# Patient Record
Sex: Male | Born: 1980
Health system: Southern US, Community
[De-identification: ages and names within clinical notes are randomized; demographics above are authoritative.]

---

## 2001-08-26 ENCOUNTER — Emergency Department (HOSPITAL_COMMUNITY): Admission: EM | Admit: 2001-08-26 | Discharge: 2001-08-26 | Payer: Self-pay | Admitting: Emergency Medicine

## 2001-08-26 ENCOUNTER — Encounter: Payer: Self-pay | Admitting: Emergency Medicine

## 2021-01-06 ENCOUNTER — Emergency Department (HOSPITAL_COMMUNITY)
Admission: EM | Admit: 2021-01-06 | Discharge: 2021-01-07 | Disposition: A | Discharge status: Home / Self Care | Payer: BC Managed Care – PPO | Attending: Emergency Medicine | Admitting: Emergency Medicine

## 2021-01-06 ENCOUNTER — Ambulatory Visit (INDEPENDENT_AMBULATORY_CARE_PROVIDER_SITE_OTHER): Payer: BC Managed Care – PPO

## 2021-01-06 ENCOUNTER — Encounter (HOSPITAL_COMMUNITY): Payer: Self-pay | Admitting: Emergency Medicine

## 2021-01-06 ENCOUNTER — Other Ambulatory Visit: Payer: Self-pay

## 2021-01-06 ENCOUNTER — Encounter: Payer: Self-pay | Admitting: Emergency Medicine

## 2021-01-06 ENCOUNTER — Ambulatory Visit
Admission: EM | Admit: 2021-01-06 | Discharge: 2021-01-06 | Disposition: A | Discharge status: Home / Self Care | Payer: BC Managed Care – PPO | Attending: Internal Medicine | Admitting: Internal Medicine

## 2021-01-06 DIAGNOSIS — R059 Cough, unspecified: Secondary | ICD-10-CM | POA: Diagnosis present

## 2021-01-06 DIAGNOSIS — Z5321 Procedure and treatment not carried out due to patient leaving prior to being seen by health care provider: Secondary | ICD-10-CM | POA: Diagnosis not present

## 2021-01-06 DIAGNOSIS — Z20822 Contact with and (suspected) exposure to covid-19: Secondary | ICD-10-CM | POA: Diagnosis not present

## 2021-01-06 DIAGNOSIS — J1282 Pneumonia due to coronavirus disease 2019: Secondary | ICD-10-CM | POA: Diagnosis not present

## 2021-01-06 DIAGNOSIS — R0602 Shortness of breath: Secondary | ICD-10-CM | POA: Diagnosis not present

## 2021-01-06 DIAGNOSIS — U071 COVID-19: Secondary | ICD-10-CM | POA: Diagnosis not present

## 2021-01-06 LAB — CBC WITH DIFFERENTIAL/PLATELET
Abs Immature Granulocytes: 0.02 10*3/uL (ref 0.00–0.07)
Basophils Absolute: 0 10*3/uL (ref 0.0–0.1)
Basophils Relative: 0 %
Eosinophils Absolute: 0 10*3/uL (ref 0.0–0.5)
Eosinophils Relative: 0 %
HCT: 43.2 % (ref 39.0–52.0)
Hemoglobin: 14.6 g/dL (ref 13.0–17.0)
Immature Granulocytes: 0 %
Lymphocytes Relative: 14 %
Lymphs Abs: 0.8 10*3/uL (ref 0.7–4.0)
MCH: 29 pg (ref 26.0–34.0)
MCHC: 33.8 g/dL (ref 30.0–36.0)
MCV: 85.9 fL (ref 80.0–100.0)
Monocytes Absolute: 0.4 10*3/uL (ref 0.1–1.0)
Monocytes Relative: 7 %
Neutro Abs: 4.3 10*3/uL (ref 1.7–7.7)
Neutrophils Relative %: 79 %
Platelets: 195 10*3/uL (ref 150–400)
RBC: 5.03 MIL/uL (ref 4.22–5.81)
RDW: 12.7 % (ref 11.5–15.5)
WBC: 5.5 10*3/uL (ref 4.0–10.5)
nRBC: 0 % (ref 0.0–0.2)

## 2021-01-06 LAB — COMPREHENSIVE METABOLIC PANEL
ALT: 85 U/L — ABNORMAL HIGH (ref 0–44)
AST: 93 U/L — ABNORMAL HIGH (ref 15–41)
Albumin: 3.6 g/dL (ref 3.5–5.0)
Alkaline Phosphatase: 61 U/L (ref 38–126)
Anion gap: 13 (ref 5–15)
BUN: 12 mg/dL (ref 6–20)
CO2: 23 mmol/L (ref 22–32)
Calcium: 8.5 mg/dL — ABNORMAL LOW (ref 8.9–10.3)
Chloride: 103 mmol/L (ref 98–111)
Creatinine, Ser: 1.37 mg/dL — ABNORMAL HIGH (ref 0.61–1.24)
GFR, Estimated: >60 mL/min (ref >60)
Glucose, Bld: 125 mg/dL — ABNORMAL HIGH (ref 70–99)
Potassium: 3.3 mmol/L — ABNORMAL LOW (ref 3.5–5.1)
Sodium: 139 mmol/L (ref 135–145)
Total Bilirubin: 0.3 mg/dL (ref 0.3–1.2)
Total Protein: 6.9 g/dL (ref 6.5–8.1)

## 2021-01-06 LAB — URINALYSIS, ROUTINE W REFLEX MICROSCOPIC
Glucose, UA: 50 mg/dL — AB
Hgb urine dipstick: NEGATIVE
Ketones, ur: 5 mg/dL — AB
Leukocytes,Ua: NEGATIVE
Nitrite: NEGATIVE
Protein, ur: 100 mg/dL — AB
Specific Gravity, Urine: 1.04 — ABNORMAL HIGH (ref 1.005–1.030)
WBC, UA: >50 WBC/hpf — ABNORMAL HIGH (ref 0–5)
pH: 5 (ref 5.0–8.0)

## 2021-01-06 LAB — RESP PANEL BY RT-PCR (FLU A&B, COVID) ARPGX2
Influenza A by PCR: NEGATIVE
Influenza B by PCR: NEGATIVE
SARS Coronavirus 2 by RT PCR: POSITIVE — AB

## 2021-01-06 LAB — LACTIC ACID, PLASMA: Lactic Acid, Venous: 1.2 mmol/L (ref 0.5–1.9)

## 2021-01-06 MED ORDER — IBUPROFEN 800 MG PO TABS
800.0000 mg | ORAL_TABLET | Freq: Once | ORAL | Status: AC
  Administered 2021-01-06: 800 mg via ORAL

## 2021-01-06 MED ORDER — BENZONATATE 100 MG PO CAPS: 100.0000 mg | ORAL_CAPSULE | Freq: Three times a day (TID) | ORAL | 0 refills | Status: DC

## 2021-01-06 MED ORDER — ALBUTEROL SULFATE HFA 108 (90 BASE) MCG/ACT IN AERS: 1.0000 | INHALATION_SPRAY | Freq: Four times a day (QID) | RESPIRATORY_TRACT | 0 refills | Status: DC | PRN

## 2021-01-06 MED ORDER — ONDANSETRON 4 MG PO TBDP: 4.0000 mg | ORAL_TABLET | Freq: Three times a day (TID) | ORAL | 0 refills | Status: DC | PRN

## 2021-01-06 MED ORDER — IBUPROFEN 600 MG PO TABS: 600.0000 mg | ORAL_TABLET | Freq: Four times a day (QID) | ORAL | 0 refills | Status: DC | PRN

## 2021-01-06 NOTE — Discharge Instructions (Addendum)
Your chest x-ray shows that you have COVID-19 pneumonia Your oxygen level is 92% and your heart rate is about 120.  I will encourage you to go to the emergency department for further management.

## 2021-01-06 NOTE — ED Triage Notes (Signed)
Pt c/o cough onset 1 week associated w/SOB, fever that has subsided.   Denies vomiting, nausea, diarrhea  Taking OTC cold meds w/temp relief.   A&O x4... NAD.Marland Kitchen. ambulatory

## 2021-01-06 NOTE — ED Provider Notes (Signed)
[facility]    CSN: [id number] Arrival date & time: 01/06/21  1346      History   Chief Complaint Chief Complaint  Patient presents with  . URI    HPI Mason Barnett is a 40 y.o. male comes to urgent care with 1 week history of worsening shortness of breath, fever of 103 Fahrenheit, nonproductive cough and chest pressure.  Patient was exposed to COVID-19 virus about a week and a half ago.  He started having symptoms around New Year's Day.  His symptoms were marked by cough, runny nose, sore throat and low-grade fever.  Over the past few days he has had worsening cough, fever and developed significant shortness of breath this morning.  No dizziness, near syncope or syncopal episode.  No chest pain or wheezing.  He has had worsening nausea, vomiting and diarrhea.   HPI  History reviewed. No pertinent past medical history.  There are no problems to display for this patient.   History reviewed. No pertinent surgical history.     Home Medications    Prior to Admission medications   Medication Sig Start Date End Date Taking? Authorizing Provider  albuterol (VENTOLIN HFA) 108 (90 Base) MCG/ACT inhaler Inhale 1-2 puffs into the lungs every 6 (six) hours as needed for wheezing or shortness of breath. 01/06/21  Yes Tylen Leverich, Britta Mccreedy, MD  benzonatate (TESSALON) 100 MG capsule Take 1 capsule (100 mg total) by mouth every 8 (eight) hours. 01/06/21  Yes Retha Bither, Britta Mccreedy, MD  ibuprofen (ADVIL) 600 MG tablet Take 1 tablet (600 mg total) by mouth every 6 (six) hours as needed. 01/06/21  Yes Cray Monnin, Britta Mccreedy, MD  ondansetron (ZOFRAN ODT) 4 MG disintegrating tablet Take 1 tablet (4 mg total) by mouth every 8 (eight) hours as needed for nausea or vomiting. 01/06/21  Yes Nisaiah Bechtol, Britta Mccreedy, MD    Family History History reviewed. No pertinent family history.  Social History Social History   Tobacco Use  . Smoking status: Never Smoker  . Smokeless tobacco: Never Used      Allergies   Patient has no known allergies.   Review of Systems Review of Systems  Constitutional: Positive for chills, fatigue and fever.  HENT: Positive for congestion and sore throat. Negative for rhinorrhea.   Respiratory: Positive for cough, chest tightness and shortness of breath. Negative for wheezing.   Gastrointestinal: Positive for diarrhea, nausea and vomiting. Negative for abdominal pain.  Genitourinary: Negative.   Musculoskeletal: Negative for joint swelling, neck pain and neck stiffness.  Neurological: Positive for dizziness, light-headedness and headaches.  Psychiatric/Behavioral: Negative for confusion.     Physical Exam Triage Vital Signs ED Triage Vitals  Enc Vitals Group     BP 01/06/21 1754 133/85     Pulse Rate 01/06/21 1754 (!) 118     Resp 01/06/21 1754 16     Temp 01/06/21 1754 (!) 103.1 F (39.5 C)     Temp Source 01/06/21 1754 Oral     SpO2 01/06/21 1754 92 %     Weight --      Height --      Head Circumference --      Peak Flow --      Pain Score 01/06/21 1709 0     Pain Loc --      Pain Edu? --      Excl. in GC? --    No data found.  Updated Vital Signs BP 133/85 (BP Location: Left Arm)  Pulse (!) 118   Temp (!) 103.1 F (39.5 C) (Oral)   Resp 16   SpO2 92%   Visual Acuity Right Eye Distance:   Left Eye Distance:   Bilateral Distance:    Right Eye Near:   Left Eye Near:    Bilateral Near:     Physical Exam Vitals and nursing note reviewed.  Constitutional:      General: He is in acute distress.     Appearance: He is ill-appearing and toxic-appearing.  Cardiovascular:     Rate and Rhythm: Tachycardia present.     Pulses: Normal pulses.     Heart sounds: Normal heart sounds.  Pulmonary:     Effort: Pulmonary effort is normal.     Comments: Decreased air entry in the lungs bilaterally.  No expiratory wheezing.  No rhonchi noted. Abdominal:     General: Bowel sounds are normal. There is no distension.      Palpations: Abdomen is soft. There is no mass.     Hernia: No hernia is present.  Musculoskeletal:        General: Normal range of motion.  Skin:    Capillary Refill: Capillary refill takes less than 2 seconds.  Neurological:     Mental Status: He is alert.      UC Treatments / Results  Labs (all labs ordered are listed, but only abnormal results are displayed) Labs Reviewed  COVID-19, FLU A+B NAA    EKG   Radiology No results found.  Procedures Procedures (including critical care time)  Medications Ordered in UC Medications  ibuprofen (ADVIL) tablet 800 mg (800 mg Oral Given 01/06/21 1802)    Initial Impression / Assessment and Plan / UC Course  I have reviewed the triage vital signs and the nursing notes.  Pertinent labs & imaging results that were available during my care of the patient were reviewed by me and considered in my medical decision making (see chart for details).      1.  Pneumonia due to COVID-19 virus: Chest x-ray is consistent with heterogeneous bilateral airspace opacities consistent with COVID-19 pneumonia Patient has a pulse oximetry of 92% and a heart rate of 118. Patient will need further work-up and higher level of care.  Patient is advised to go to the emergency department.  Patient agrees to go to the emergency department Final Clinical Impressions(s) / UC Diagnoses   Final diagnoses:  Pneumonia due to COVID-19 virus     Discharge Instructions     Your chest x-ray shows that you have COVID-19 pneumonia Your oxygen level is 92% and your heart rate is about 120.  I will encourage you to go to the emergency department for further management.   ED Prescriptions    Medication Sig Dispense Auth. Provider   albuterol (VENTOLIN HFA) 108 (90 Base) MCG/ACT inhaler Inhale 1-2 puffs into the lungs every 6 (six) hours as needed for wheezing or shortness of breath. 18 g Nickolai Rinks, Britta Mccreedy, MD   ondansetron (ZOFRAN ODT) 4 MG disintegrating tablet  Take 1 tablet (4 mg total) by mouth every 8 (eight) hours as needed for nausea or vomiting. 20 tablet Ronnisha Felber, Britta Mccreedy, MD   benzonatate (TESSALON) 100 MG capsule Take 1 capsule (100 mg total) by mouth every 8 (eight) hours. 21 capsule Izack Hoogland, Britta Mccreedy, MD   ibuprofen (ADVIL) 600 MG tablet Take 1 tablet (600 mg total) by mouth every 6 (six) hours as needed. 30 tablet Latorya Bautch, Britta Mccreedy, MD  PDMP not reviewed this encounter.   Merrilee Jansky, MD 01/06/21 [phone]

## 2021-01-06 NOTE — ED Triage Notes (Signed)
Pt c/o increased cough/shob, seen at UC, sent here for further eval of xray showing COVID PNA.  Pts wife +, pt has not been vaccinated

## 2021-01-07 NOTE — ED Notes (Signed)
Pt name called 3x for updated vitals, no response  

## 2021-01-08 LAB — COVID-19, FLU A+B NAA
Influenza A, NAA: NOT DETECTED
Influenza B, NAA: NOT DETECTED
SARS-CoV-2, NAA: DETECTED — AB

## 2021-01-11 ENCOUNTER — Inpatient Hospital Stay (HOSPITAL_COMMUNITY)
Admission: EM | Admit: 2021-01-11 | Discharge: 2021-01-14 | DRG: 177 | Disposition: A | Discharge status: Home / Self Care | Payer: BC Managed Care – PPO | Attending: Internal Medicine | Admitting: Internal Medicine

## 2021-01-11 ENCOUNTER — Encounter (HOSPITAL_COMMUNITY): Payer: Self-pay | Admitting: *Deleted

## 2021-01-11 ENCOUNTER — Emergency Department (HOSPITAL_COMMUNITY): Payer: BC Managed Care – PPO

## 2021-01-11 ENCOUNTER — Other Ambulatory Visit: Payer: Self-pay

## 2021-01-11 DIAGNOSIS — R0602 Shortness of breath: Secondary | ICD-10-CM | POA: Diagnosis not present

## 2021-01-11 DIAGNOSIS — U071 COVID-19: Secondary | ICD-10-CM | POA: Diagnosis not present

## 2021-01-11 DIAGNOSIS — Z8249 Family history of ischemic heart disease and other diseases of the circulatory system: Secondary | ICD-10-CM

## 2021-01-11 DIAGNOSIS — J9601 Acute respiratory failure with hypoxia: Secondary | ICD-10-CM | POA: Diagnosis present

## 2021-01-11 DIAGNOSIS — R0902 Hypoxemia: Secondary | ICD-10-CM

## 2021-01-11 DIAGNOSIS — J1282 Pneumonia due to coronavirus disease 2019: Secondary | ICD-10-CM | POA: Diagnosis present

## 2021-01-11 LAB — COMPREHENSIVE METABOLIC PANEL
ALT: 109 U/L — ABNORMAL HIGH (ref 0–44)
AST: 58 U/L — ABNORMAL HIGH (ref 15–41)
Albumin: 2.9 g/dL — ABNORMAL LOW (ref 3.5–5.0)
Alkaline Phosphatase: 105 U/L (ref 38–126)
Anion gap: 14 (ref 5–15)
BUN: 14 mg/dL (ref 6–20)
CO2: 25 mmol/L (ref 22–32)
Calcium: 8.6 mg/dL — ABNORMAL LOW (ref 8.9–10.3)
Chloride: 101 mmol/L (ref 98–111)
Creatinine, Ser: 1.09 mg/dL (ref 0.61–1.24)
GFR, Estimated: >60 mL/min (ref >60)
Glucose, Bld: 127 mg/dL — ABNORMAL HIGH (ref 70–99)
Potassium: 3.5 mmol/L (ref 3.5–5.1)
Sodium: 140 mmol/L (ref 135–145)
Total Bilirubin: 2.4 mg/dL — ABNORMAL HIGH (ref 0.3–1.2)
Total Protein: 6.5 g/dL (ref 6.5–8.1)

## 2021-01-11 LAB — C-REACTIVE PROTEIN: CRP: 5.1 mg/dL — ABNORMAL HIGH (ref <1.0)

## 2021-01-11 LAB — LIPASE, BLOOD: Lipase: 37 U/L (ref 11–51)

## 2021-01-11 LAB — CBC
HCT: 44.1 % (ref 39.0–52.0)
Hemoglobin: 14.2 g/dL (ref 13.0–17.0)
MCH: 28.6 pg (ref 26.0–34.0)
MCHC: 32.2 g/dL (ref 30.0–36.0)
MCV: 88.7 fL (ref 80.0–100.0)
Platelets: 354 10*3/uL (ref 150–400)
RBC: 4.97 MIL/uL (ref 4.22–5.81)
RDW: 12.5 % (ref 11.5–15.5)
WBC: 8.5 10*3/uL (ref 4.0–10.5)
nRBC: 0 % (ref 0.0–0.2)

## 2021-01-11 LAB — LACTATE DEHYDROGENASE: LDH: 414 U/L — ABNORMAL HIGH (ref 98–192)

## 2021-01-11 LAB — FERRITIN: Ferritin: 1703 ng/mL — ABNORMAL HIGH (ref 24–336)

## 2021-01-11 LAB — TRIGLYCERIDES: Triglycerides: 193 mg/dL — ABNORMAL HIGH (ref <150)

## 2021-01-11 LAB — FIBRINOGEN: Fibrinogen: 665 mg/dL — ABNORMAL HIGH (ref 210–475)

## 2021-01-11 LAB — LACTIC ACID, PLASMA
Lactic Acid, Venous: 2.2 mmol/L (ref 0.5–1.9)
Lactic Acid, Venous: 2 mmol/L (ref 0.5–1.9)

## 2021-01-11 LAB — D-DIMER, QUANTITATIVE: D-Dimer, Quant: 1.82 ug/mL-FEU — ABNORMAL HIGH (ref 0.00–0.50)

## 2021-01-11 LAB — PROCALCITONIN: Procalcitonin: 0.1 ng/mL

## 2021-01-11 MED ORDER — DEXAMETHASONE SODIUM PHOSPHATE 10 MG/ML IJ SOLN
6.0000 mg | Freq: Once | INTRAMUSCULAR | Status: AC
  Administered 2021-01-11: 6 mg via INTRAVENOUS
  Filled 2021-01-11: qty 1

## 2021-01-11 MED ORDER — LACTATED RINGERS IV BOLUS
500.0000 mL | Freq: Once | INTRAVENOUS | Status: AC
  Administered 2021-01-11: 500 mL via INTRAVENOUS

## 2021-01-11 MED ORDER — ENOXAPARIN SODIUM 60 MG/0.6ML ~~LOC~~ SOLN
55.0000 mg | SUBCUTANEOUS | Status: DC
  Administered 2021-01-11 – 2021-01-14 (×4): 55 mg via SUBCUTANEOUS
  Filled 2021-01-11: qty 0.6
  Filled 2021-01-11 (×4): qty 0.55

## 2021-01-11 MED ORDER — GUAIFENESIN-DM 100-10 MG/5ML PO SYRP
5.0000 mL | ORAL_SOLUTION | ORAL | Status: DC | PRN
  Administered 2021-01-11: 5 mL via ORAL
  Filled 2021-01-11 (×2): qty 5

## 2021-01-11 MED ORDER — SODIUM CHLORIDE 0.9 % IV SOLN
100.0000 mg | Freq: Every day | INTRAVENOUS | Status: DC
  Filled 2021-01-11: qty 20

## 2021-01-11 MED ORDER — SODIUM CHLORIDE 0.9 % IV SOLN
200.0000 mg | Freq: Once | INTRAVENOUS | Status: AC
  Administered 2021-01-11: 200 mg via INTRAVENOUS
  Filled 2021-01-11: qty 40

## 2021-01-11 MED ORDER — DEXAMETHASONE 6 MG PO TABS
6.0000 mg | ORAL_TABLET | Freq: Every day | ORAL | Status: DC
  Administered 2021-01-12 – 2021-01-14 (×3): 6 mg via ORAL
  Filled 2021-01-11: qty 1
  Filled 2021-01-11 (×2): qty 2

## 2021-01-11 NOTE — ED Notes (Signed)
Pt transported to xray 

## 2021-01-11 NOTE — ED Triage Notes (Signed)
Pt states he has covid pneumonia and is here for shortness of breath. Has had some diarrhea. Weakness. Saturations on room air 90%, placed on 2 liters.

## 2021-01-11 NOTE — ED Provider Notes (Signed)
[facility] EMERGENCY DEPARTMENT Provider Note   CSN: [id number] Arrival date & time: 01/11/21  0454     History Chief Complaint  Patient presents with  . Shortness of Breath    Mason Barnett is a 40 y.o. male who presents to the ED via EMS with complaint of worsening shortness of breath over the past week. Pt was seen at Urgent Care on 1/10 with complaints of nonproductive cough, chest pressure, fever with tmax 103.0, and shortness of breath. He was positive for COVID 19 at that time. Per chart review pt's O2 was around 92% at that time and he was recommended to come to the ED for further evaluation. Pt came to the ED however LWBS. He returns today for worsening symptoms. With EMS pt was found to have an O2 sat of 90% at rest and placed on 3L. Pt denies any additional symptoms including nausea, vomiting, diarrhea, chest pain, coughing up blood, or any other associated symptoms. He is not typically on oxygen.   The history is provided by the patient and medical records.       History reviewed. No pertinent past medical history.  There are no problems to display for this patient.   History reviewed. No pertinent surgical history.     No family history on file.  Social History   Tobacco Use  . Smoking status: Never Smoker  . Smokeless tobacco: Never Used  Substance Use Topics  . Alcohol use: Not Currently  . Drug use: Never    Home Medications Prior to Admission medications   Medication Sig Start Date End Date Taking? Authorizing Provider  albuterol (VENTOLIN HFA) 108 (90 Base) MCG/ACT inhaler Inhale 1-2 puffs into the lungs every 6 (six) hours as needed for wheezing or shortness of breath. 01/06/21   Lamptey, Britta Mccreedy, MD  benzonatate (TESSALON) 100 MG capsule Take 1 capsule (100 mg total) by mouth every 8 (eight) hours. 01/06/21   Merrilee Jansky, MD  ibuprofen (ADVIL) 600 MG tablet Take 1 tablet (600 mg total) by mouth every 6 (six) hours as  needed. 01/06/21   Lamptey, Britta Mccreedy, MD  ondansetron (ZOFRAN ODT) 4 MG disintegrating tablet Take 1 tablet (4 mg total) by mouth every 8 (eight) hours as needed for nausea or vomiting. 01/06/21   Lamptey, Britta Mccreedy, MD    Allergies    Patient has no known allergies.  Review of Systems   Review of Systems  Constitutional: Positive for chills, fatigue and fever.  Respiratory: Positive for cough and shortness of breath.   Cardiovascular: Negative for chest pain.  Gastrointestinal: Negative for abdominal pain, diarrhea, nausea and vomiting.  Musculoskeletal: Positive for myalgias.  All other systems reviewed and are negative.   Physical Exam Updated Vital Signs BP (!) 106/59 (BP Location: Right Arm)   Pulse 86   Temp 98.3 F (36.8 C) (Oral)   Resp 18   SpO2 97%   Physical Exam Vitals and nursing note reviewed.  Constitutional:      Appearance: He is ill-appearing and diaphoretic.  HENT:     Head: Normocephalic and atraumatic.  Eyes:     Conjunctiva/sclera: Conjunctivae normal.  Cardiovascular:     Rate and Rhythm: Normal rate and regular rhythm.     Pulses: Normal pulses.  Pulmonary:     Effort: Tachypnea present.     Breath sounds: Decreased breath sounds present. No wheezing, rhonchi or rales.     Comments: Pt tachypneic with diminished breath sounds throughout  all lung fields. Speaking in short sentences. Satting 90-92% at rest. With ambulation pt only able to walk a very short distance before sitting down. Once seated his O2 sat dropped to 89% and took several minutes to go back up to 90%. Placed on 2L.  Chest:     Chest wall: No tenderness.  Abdominal:     Palpations: Abdomen is soft.     Tenderness: There is no abdominal tenderness. There is no guarding or rebound.  Musculoskeletal:     Cervical back: Neck supple.     Right lower leg: No edema.     Left lower leg: No edema.  Skin:    General: Skin is warm.  Neurological:     Mental Status: He is alert.     ED  Results / Procedures / Treatments   Labs (all labs ordered are listed, but only abnormal results are displayed) Labs Reviewed  COMPREHENSIVE METABOLIC PANEL - Abnormal; Notable for the following components:      Result Value   Glucose, Bld 127 (*)    Calcium 8.6 (*)    Albumin 2.9 (*)    AST 58 (*)    ALT 109 (*)    Total Bilirubin 2.4 (*)    All other components within normal limits  LACTIC ACID, PLASMA - Abnormal; Notable for the following components:   Lactic Acid, Venous 2.2 (*)    All other components within normal limits  D-DIMER, QUANTITATIVE (NOT AT [facility]) - Abnormal; Notable for the following components:   D-Dimer, Quant 1.82 (*)    All other components within normal limits  LACTATE DEHYDROGENASE - Abnormal; Notable for the following components:   LDH 414 (*)    All other components within normal limits  FERRITIN - Abnormal; Notable for the following components:   Ferritin 1,703 (*)    All other components within normal limits  TRIGLYCERIDES - Abnormal; Notable for the following components:   Triglycerides 193 (*)    All other components within normal limits  FIBRINOGEN - Abnormal; Notable for the following components:   Fibrinogen 665 (*)    All other components within normal limits  C-REACTIVE PROTEIN - Abnormal; Notable for the following components:   CRP 5.1 (*)    All other components within normal limits  CULTURE, BLOOD (ROUTINE X 2)  CULTURE, BLOOD (ROUTINE X 2)  LIPASE, BLOOD  CBC  URINALYSIS, ROUTINE W REFLEX MICROSCOPIC  LACTIC ACID, PLASMA  PROCALCITONIN    EKG EKG Interpretation  Date/Time:  Saturday January 11 2021 10:01:37 EST Ventricular Rate:  82 PR Interval:    QRS Duration: 80 QT Interval:  400 QTC Calculation: 468 R Axis:   51 Text Interpretation: Sinus rhythm Normal ECG No old tracing to compare Confirmed by Susy Frizzle [phone]) on 01/11/2021 10:12:59 AM   Radiology DG Chest Portable 1 View  Result Date: 01/11/2021 CLINICAL  DATA:  COVID positive, dyspnea, intermittent cough for 1 week EXAM: PORTABLE CHEST 1 VIEW COMPARISON:  01/06/2021 chest radiograph. FINDINGS: Stable cardiomediastinal silhouette with normal heart size. No pneumothorax. No pleural effusion. Moderate to severe patchy bilateral lung opacities, worsened. IMPRESSION: Worsening moderate to severe patchy bilateral lung opacities compatible with COVID-19 pneumonia. Electronically Signed   By: Delbert Phenix M.D.   On: 01/11/2021 08:52    Procedures Procedures (including critical care time)  Medications Ordered in ED Medications  remdesivir 200 mg in sodium chloride 0.9% 250 mL IVPB (has no administration in time range)    Followed by  remdesivir 100 mg in sodium chloride 0.9 % 100 mL IVPB (has no administration in time range)  dexamethasone (DECADRON) injection 6 mg (6 mg Intravenous Given 01/11/21 1002)    ED Course  I have reviewed the triage vital signs and the nursing notes.  Pertinent labs & imaging results that were available during my care of the patient were reviewed by me and considered in my medical decision making (see chart for details).    MDM Rules/Calculators/A&P                          40 year old male who is unvaccinated presenting to the ED today with EMS for worsening shortness of breath.  Seen at urgent care on 1/10 tested positive for COVID-19.  Does appear that his O2 sats were diminished at that time and 92% and he was advised to come to the ED however left prior to being seen.  With EMS arrival he was found to be at 90% on room air, placed on 3 L and sent here.  Exam patient is tachypneic with diminished breath sounds throughout.  He was taken off of oxygen and his O2 sat was around 90% at rest.  I had patient ambulate in the room he was only able to walk a very short distance before sitting down when his O2 sat decreased to 89% when sitting down it took several minutes to increase.  Given this and her chest x-ray was still show  worsening opacities consistent with COVID-pneumonia I do feel patient will need to be admitted at this time.  We will order preadmission COVID labs.  We do have a positive test in our system and therefore he does not need to be retested.  Lab work was obtained while patient was in the waiting room with a stable creatinine 1.09.  Decadron and remdesivir ordered.   Discussed case with Internal Medicine who will come evaluate patient for admission.   This note was prepared using Dragon voice recognition software and may include unintentional dictation errors due to the inherent limitations of voice recognition software.  Mason Barnett was evaluated in Emergency Department on 01/11/2021 for the symptoms described in the history of present illness. He was evaluated in the context of the global COVID-19 pandemic, which necessitated consideration that the patient might be at risk for infection with the SARS-CoV-2 virus that causes COVID-19. Institutional protocols and algorithms that pertain to the evaluation of patients at risk for COVID-19 are in a state of rapid change based on information released by regulatory bodies including the CDC and federal and state organizations. These policies and algorithms were followed during the patient's care in the ED.  Final Clinical Impression(s) / ED Diagnoses Final diagnoses:  COVID-19  Hypoxia    Rx / DC Orders ED Discharge Orders    None       Tanda Rockers, PA-C 01/11/21 1138    Pollyann Savoy, MD 01/11/21 1229

## 2021-01-11 NOTE — ED Triage Notes (Addendum)
Pt arrives via [facility] from home with c/o generalized weakness. Pt with recent diagnosis covid pna.  EMS arrival to scene, pt saturations 90%, placed on 3 liters [place], HR 71, BP 122/80.

## 2021-01-11 NOTE — H&P (Signed)
Date: 01/11/2021               Patient Name:  Mason Barnett MRN: [id number]  DOB: 10/12/1981 Age / Sex: 40 y.o., male   PCP: Patient, No Pcp Per         Medical Service: Internal Medicine Teaching Service         Attending Physician: Dr. Mikey Bussing    First Contact: Dr. Laural Benes Pager: [phone]  Second Contact: Dr. Cleaster Corin Pager: [phone]       After Hours (After 5p/  First Contact Pager: [phone]  weekends / holidays): Second Contact Pager: [phone]   Chief Complaint: Shortness of breath  History of Present Illness: Mr. Mason Barnett is a 40 year old male with past medical history significant for COVID-19 (tested positive on 01/10) who presented to [facility] on 01/15 for evaluation of worsening shortness of breath.   Patient has not been vaccinated against COVID-19. He reports that on January 1st his wife developed respiratory symptoms. On January 3rd or 4th, his wife tested positive on an at-home antigen test. Shortly thereafter, patient began to develop symptoms of a non-productive cough and sore throat. His symptoms continued to progress, he developed some mild shortness of breath and he had a fever to 103.0 at home so he presented to urgent care on 01/10 for evaluation and was found to have COVID-19 infection. Due to his oxygen saturation being 92% he was advised to come to the ED for evaluation. He presented to the ED, but he left without being seen due to wait times. Since then, he reports that his shortness of breath has been progressively worsening. Yesterday evening, he had difficulty ambulating to the bathroom due to the severity of his symptoms. He denies abdominal pain, nausea, vomiting, lightheadedness, dizziness, night-sweats, or body aches.  ED Course: On arrival to the ED, patient was afebrile, tachycardic (102) and saturating at 91% on room air with improvement to 96% after being placed on 2L oxygen via nasal cannula. EKG normal sinus rhythm. Initial labs revealed  unremarkable CBC; CMP with AST 58, ALT 109, TBili 2.4 and glucose 127; lactic acid 2.2; d-dimer 1.82; fibrinogen 665; triglycerides 193; LDH 414; ferritin 1,703; CRP 5.1; procalcitonin 0.10; blood cultures were collected. CXR revealed worsening moderate to severe patchy bilateral lung opacities compatible with COVID-19 pneumonia. He received 6mg  IV dexamethasone and was admitted to [facility] for further evaluation and management.  Meds:  No current facility-administered medications on file prior to encounter.   Current Outpatient Medications on File Prior to Encounter  Medication Sig Dispense Refill  . albuterol (VENTOLIN HFA) 108 (90 Base) MCG/ACT inhaler Inhale 1-2 puffs into the lungs every 6 (six) hours as needed for wheezing or shortness of breath. 18 g 0  . benzonatate (TESSALON) 100 MG capsule Take 1 capsule (100 mg total) by mouth every 8 (eight) hours. 21 capsule 0  . ibuprofen (ADVIL) 600 MG tablet Take 1 tablet (600 mg total) by mouth every 6 (six) hours as needed. 30 tablet 0  . ondansetron (ZOFRAN ODT) 4 MG disintegrating tablet Take 1 tablet (4 mg total) by mouth every 8 (eight) hours as needed for nausea or vomiting. 20 tablet 0   Allergies: Allergies as of 01/11/2021  . (No Known Allergies)   Past Medical History: Patient denies any prior medical conditions. He was last evaluated by a physician approximately five years ago.  Family History: Mother - Prediabetes Father - Hypertension No family history of lung pathology  Social History: Lives  in [place] with wife, children and parents. He endorses occassional alcohol consumption but denies tobacco use or recreational drug use.  Review of Systems: A complete ROS was negative except as per HPI.   Physical Exam: Blood pressure (!) 135/96, pulse (!) 105, temperature 98.3 F (36.8 C), temperature source Oral, resp. rate 20, SpO2 98 %. Physical Exam Vitals and nursing note reviewed.  Constitutional:      General: He is not  in acute distress. HENT:     Head: Normocephalic and atraumatic.     Mouth/Throat:     Mouth: Mucous membranes are moist.     Pharynx: Oropharynx is clear.  Cardiovascular:     Rate and Rhythm: Normal rate and regular rhythm.  Pulmonary:     Effort: Pulmonary effort is normal. Tachypnea present. No respiratory distress.  Abdominal:     General: Bowel sounds are normal.     Palpations: Abdomen is soft.     Tenderness: There is no abdominal tenderness.  Musculoskeletal:        General: Normal range of motion.     Right lower leg: No edema.     Left lower leg: No edema.  Skin:    General: Skin is warm and dry.     Capillary Refill: Capillary refill takes less than 2 seconds.  Neurological:     General: No focal deficit present.     Mental Status: He is alert and oriented to person, place, and time.  Psychiatric:        Mood and Affect: Mood normal.        Behavior: Behavior normal.    EKG: personally reviewed my interpretation is normal sinus rhythm  CXR: personally reviewed my interpretation is bilateral patchy lung opacities  Assessment & Plan by Problem: Active Problems:   Acute hypoxemic respiratory failure due to COVID-19 Mason Barnett)  Mr. Mason Barnett is a 40 year old male with past medical history significant for COVID-19 (tested positive on 01/10) who presented to [facility] on 01/15 for evaluation of worsening shortness of breath.  #Acute hypoxemic respiratory failure 2/2 COVID-19 pneumonia Unvaccinated patient, currently on day 5 since testing positive for COVID-19 infection, however symptoms had been occurring for several days prior to positive test result. He was saturating ~90% on room air and has responded well to 2L oxygen via nasal cannula. Due to his hypoxia, he would benefit from Barnett admission with administration of dexamethasone and remdesivir. -Continue dexamethasone -Continue remdesivir -CMP tomorrow morning -Continuous pulse oximetry -Nasal cannula to  maintain SpO2 >92% -Airborne/contact precautions -Telemetry -Robitussin Q4H PRN  #Code status: Full code #Diet: Regular #VTE ppx: Lovenox 55mg  Q24H  Dispo: Admit patient to Observation with expected length of stay less than 2 midnights.  Signed: , MD 01/11/2021, 12:23 PM  Pager: [phone] After 5pm on weekdays and 1pm on weekends: On Call pager: [phone]

## 2021-01-12 DIAGNOSIS — U071 COVID-19: Secondary | ICD-10-CM | POA: Diagnosis present

## 2021-01-12 DIAGNOSIS — R0602 Shortness of breath: Secondary | ICD-10-CM | POA: Diagnosis present

## 2021-01-12 DIAGNOSIS — J9601 Acute respiratory failure with hypoxia: Secondary | ICD-10-CM | POA: Diagnosis present

## 2021-01-12 DIAGNOSIS — J1282 Pneumonia due to coronavirus disease 2019: Secondary | ICD-10-CM | POA: Diagnosis present

## 2021-01-12 DIAGNOSIS — Z8249 Family history of ischemic heart disease and other diseases of the circulatory system: Secondary | ICD-10-CM | POA: Diagnosis not present

## 2021-01-12 LAB — CBC WITH DIFFERENTIAL/PLATELET
Abs Immature Granulocytes: 0 10*3/uL (ref 0.00–0.07)
Basophils Absolute: 0 10*3/uL (ref 0.0–0.1)
Basophils Relative: 0 %
Eosinophils Absolute: 0 10*3/uL (ref 0.0–0.5)
Eosinophils Relative: 0 %
HCT: 40.6 % (ref 39.0–52.0)
Hemoglobin: 13 g/dL (ref 13.0–17.0)
Lymphocytes Relative: 8 %
Lymphs Abs: 0.7 10*3/uL (ref 0.7–4.0)
MCH: 28.4 pg (ref 26.0–34.0)
MCHC: 32 g/dL (ref 30.0–36.0)
MCV: 88.8 fL (ref 80.0–100.0)
Monocytes Absolute: 0.3 10*3/uL (ref 0.1–1.0)
Monocytes Relative: 3 %
Neutro Abs: 8.2 10*3/uL — ABNORMAL HIGH (ref 1.7–7.7)
Neutrophils Relative %: 89 %
Platelets: 413 10*3/uL — ABNORMAL HIGH (ref 150–400)
RBC: 4.57 MIL/uL (ref 4.22–5.81)
RDW: 12.6 % (ref 11.5–15.5)
WBC: 9.2 10*3/uL (ref 4.0–10.5)
nRBC: 0 % (ref 0.0–0.2)
nRBC: 0 /100 WBC

## 2021-01-12 LAB — COMPREHENSIVE METABOLIC PANEL
ALT: 129 U/L — ABNORMAL HIGH (ref 0–44)
AST: 81 U/L — ABNORMAL HIGH (ref 15–41)
Albumin: 2.7 g/dL — ABNORMAL LOW (ref 3.5–5.0)
Alkaline Phosphatase: 101 U/L (ref 38–126)
Anion gap: 12 (ref 5–15)
BUN: 18 mg/dL (ref 6–20)
CO2: 24 mmol/L (ref 22–32)
Calcium: 8.3 mg/dL — ABNORMAL LOW (ref 8.9–10.3)
Chloride: 106 mmol/L (ref 98–111)
Creatinine, Ser: 0.99 mg/dL (ref 0.61–1.24)
GFR, Estimated: >60 mL/min (ref >60)
Glucose, Bld: 118 mg/dL — ABNORMAL HIGH (ref 70–99)
Potassium: 3.4 mmol/L — ABNORMAL LOW (ref 3.5–5.1)
Sodium: 142 mmol/L (ref 135–145)
Total Bilirubin: 1.4 mg/dL — ABNORMAL HIGH (ref 0.3–1.2)
Total Protein: 6.1 g/dL — ABNORMAL LOW (ref 6.5–8.1)

## 2021-01-12 LAB — URINALYSIS, ROUTINE W REFLEX MICROSCOPIC
Bacteria, UA: NONE SEEN
Glucose, UA: NEGATIVE mg/dL
Hgb urine dipstick: NEGATIVE
Ketones, ur: 5 mg/dL — AB
Leukocytes,Ua: NEGATIVE
Nitrite: NEGATIVE
Protein, ur: 30 mg/dL — AB
Specific Gravity, Urine: 1.026 (ref 1.005–1.030)
pH: 5 (ref 5.0–8.0)

## 2021-01-12 LAB — HIV ANTIBODY (ROUTINE TESTING W REFLEX): HIV Screen 4th Generation wRfx: NONREACTIVE

## 2021-01-12 LAB — MAGNESIUM: Magnesium: 2.2 mg/dL (ref 1.7–2.4)

## 2021-01-12 LAB — D-DIMER, QUANTITATIVE: D-Dimer, Quant: 1.63 ug/mL-FEU — ABNORMAL HIGH (ref 0.00–0.50)

## 2021-01-12 LAB — FERRITIN: Ferritin: 1267 ng/mL — ABNORMAL HIGH (ref 24–336)

## 2021-01-12 LAB — C-REACTIVE PROTEIN: CRP: 3 mg/dL — ABNORMAL HIGH (ref <1.0)

## 2021-01-12 NOTE — ED Notes (Signed)
Lunch Tray Ordered @ 1037. 

## 2021-01-12 NOTE — Progress Notes (Signed)
Subjective:   Overnight, no acute events.  This morning, patient reports continued cough and shortness of breath with ambulation but not at rest. He denies any new or worsening symptoms. He reports that he has a strong appetite and has been eating and drinking well. He has no further questions or concerns.  Objective:  Vital signs in last 24 hours: Vitals:   01/12/21 0755 01/12/21 0800 01/12/21 0830 01/12/21 0845  BP:  112/75 103/68   Pulse:  97 93 81  Resp:  (!) 30 (!) 27 (!) 22  Temp: 99.1 F (37.3 C)     TempSrc: Oral     SpO2:  (!) 87% (!) 87% 95%  On 4L oxygen via nasal cannula No intake or output data in the 24 hours ending 01/12/21 1003 No weights recorded  Physical Exam Vitals and nursing note reviewed.  Constitutional:      General: He is not in acute distress.    Appearance: He is ill-appearing.  Cardiovascular:     Rate and Rhythm: Normal rate and regular rhythm.  Pulmonary:     Effort: Pulmonary effort is normal.     Comments: Mildly coarse lung sounds throughout lung fields Abdominal:     General: Bowel sounds are normal.     Palpations: Abdomen is soft.     Tenderness: There is no abdominal tenderness.  Skin:    Capillary Refill: Capillary refill takes less than 2 seconds.  Neurological:     General: No focal deficit present.     Mental Status: He is alert and oriented to person, place, and time.  Psychiatric:        Mood and Affect: Mood normal.        Behavior: Behavior normal.     Labs in last 24 hours: CBC Latest Ref Rng & Units 01/12/2021 01/11/2021 01/06/2021  WBC 4.0 - 10.5 K/uL 9.2 8.5 5.5  Hemoglobin 13.0 - 17.0 g/dL 23.5 57.3 22.0  Hematocrit 39.0 - 52.0 % 40.6 44.1 43.2  Platelets 150 - 400 K/uL 413(H) 354 195   CMP Latest Ref Rng & Units 01/12/2021 01/11/2021 01/06/2021  Glucose 70 - 99 mg/dL 254(Y) 706(C) 376(E)  BUN 6 - 20 mg/dL 18 14 12   Creatinine 0.61 - 1.24 mg/dL 8.31 5.17)  Sodium 135 - 145 mmol/L 142 140 139  Potassium  3.5 - 5.1 mmol/L 3.4(L) 3.5 3.3(L)  Chloride 98 - 111 mmol/L 106 101 103  CO2 22 - 32 mmol/L 24 25 23   Calcium 8.9 - 10.3 mg/dL 8.3(L) 8.6(L) 8.5(L)  Total Protein 6.5 - 8.1 g/dL 6.1(L) 6.5 6.9  Total Bilirubin 0.3 - 1.2 mg/dL 6.16(W) 2.4(H) 0.3  Alkaline Phos 38 - 126 U/L 101 105 61  AST 15 - 41 U/L 81(H) 58(H) 93(H)  ALT 0 - 44 U/L 129(H) 109(H) 85(H)   CRP - 3.0, 5.1 Ferritin - 1,267, 1,703 D-dimer - 1.63, 1.82 Mg - 2.2 HIV - nonreactive  Imaging in last 24 hours: No results found.   Assessment/Plan:  Active Problems:   Acute hypoxemic respiratory failure due to COVID-19 [facility])  Mr. Ronnie Doo is a 40 year old male with past medical history significant for COVID-19 (tested positive on 01/10) who presented to [facility] on 01/15 for evaluation of worsening shortness of breath found to have acute hypoxemic respiratory failure 2/2 COVID-19 pneumonia.  #Acute hypoxemic respiratory failure 2/2 COVID-19 pneumonia Unvaccinated patient, currently on day 6 since testing positive for COVID-19 infection. Patient denies new or worsening symptoms at rest  but continues to endorse cough and shortness of breath with ambulation. No fevers. Oxygen requirement escalated from 2L to 4L overnight due to progressive hypoxia. Inflammatory markers trending down. -Continue dexamethasone (day 2/10) -Discontinue remdesivir -CRP, CBC with Diff/Platelet, CMP, D-dimer, Ferritin, Mg daily -Continue 4L oxygen via nasal cannula to maintain SpO2 >92% -Airborne/contact precautions -Telemetry -Robitussin Q4H PRN  #Elevated transaminases, active  AST and ALT trending up in setting of active COVID-19 infection and remdesivir administration. -Continue treatment for COVID-19 as above -Continue monitoring liver function tests on daily labs  #Code status: Full code #Diet: Regular #VTE ppx: Lovenox 55mg  Q24H  , MD 01/12/2021, 10:03 AM Pager: [phone] After 5pm on weekdays and 1pm on  weekends: On Call pager [phone]

## 2021-01-13 LAB — CBC WITH DIFFERENTIAL/PLATELET
Abs Immature Granulocytes: 0.5 10*3/uL — ABNORMAL HIGH (ref 0.00–0.07)
Basophils Absolute: 0 10*3/uL (ref 0.0–0.1)
Basophils Relative: 0 %
Eosinophils Absolute: 0 10*3/uL (ref 0.0–0.5)
Eosinophils Relative: 0 %
HCT: 40.3 % (ref 39.0–52.0)
Hemoglobin: 12.7 g/dL — ABNORMAL LOW (ref 13.0–17.0)
Immature Granulocytes: 5 %
Lymphocytes Relative: 16 %
Lymphs Abs: 1.7 10*3/uL (ref 0.7–4.0)
MCH: 28.2 pg (ref 26.0–34.0)
MCHC: 31.5 g/dL (ref 30.0–36.0)
MCV: 89.4 fL (ref 80.0–100.0)
Monocytes Absolute: 1.3 10*3/uL — ABNORMAL HIGH (ref 0.1–1.0)
Monocytes Relative: 12 %
Neutro Abs: 7.5 10*3/uL (ref 1.7–7.7)
Neutrophils Relative %: 67 %
Platelets: 454 10*3/uL — ABNORMAL HIGH (ref 150–400)
RBC: 4.51 MIL/uL (ref 4.22–5.81)
RDW: 12.7 % (ref 11.5–15.5)
WBC: 11 10*3/uL — ABNORMAL HIGH (ref 4.0–10.5)
nRBC: 0 % (ref 0.0–0.2)

## 2021-01-13 LAB — COMPREHENSIVE METABOLIC PANEL
ALT: 128 U/L — ABNORMAL HIGH (ref 0–44)
AST: 45 U/L — ABNORMAL HIGH (ref 15–41)
Albumin: 2.8 g/dL — ABNORMAL LOW (ref 3.5–5.0)
Alkaline Phosphatase: 90 U/L (ref 38–126)
Anion gap: 11 (ref 5–15)
BUN: 20 mg/dL (ref 6–20)
CO2: 24 mmol/L (ref 22–32)
Calcium: 8.7 mg/dL — ABNORMAL LOW (ref 8.9–10.3)
Chloride: 107 mmol/L (ref 98–111)
Creatinine, Ser: 0.97 mg/dL (ref 0.61–1.24)
GFR, Estimated: >60 mL/min (ref >60)
Glucose, Bld: 120 mg/dL — ABNORMAL HIGH (ref 70–99)
Potassium: 3.6 mmol/L (ref 3.5–5.1)
Sodium: 142 mmol/L (ref 135–145)
Total Bilirubin: 1.1 mg/dL (ref 0.3–1.2)
Total Protein: 6.1 g/dL — ABNORMAL LOW (ref 6.5–8.1)

## 2021-01-13 LAB — C-REACTIVE PROTEIN: CRP: 1.1 mg/dL — ABNORMAL HIGH (ref <1.0)

## 2021-01-13 LAB — FERRITIN: Ferritin: 859 ng/mL — ABNORMAL HIGH (ref 24–336)

## 2021-01-13 LAB — D-DIMER, QUANTITATIVE: D-Dimer, Quant: 1.08 ug/mL-FEU — ABNORMAL HIGH (ref 0.00–0.50)

## 2021-01-13 LAB — MAGNESIUM: Magnesium: 2.2 mg/dL (ref 1.7–2.4)

## 2021-01-13 NOTE — Progress Notes (Signed)
Subjective:   Overnight, no acute events.  This morning, patient reports that he feels well at rest. He endorses shortness of breath and nonproductive cough with ambulation around his room. He denies any new or worsening symptoms. He looks forward to breakfast and moving from the ED to the hospital floor.  Objective:  Vital signs in last 24 hours: Vitals:   01/13/21 0200 01/13/21 0400 01/13/21 0530 01/13/21 0630  BP: 111/66 109/67 (!) 110/59 122/66  Pulse: (!) 52 72 (!) 47 (!) 51  Resp: 17 18 17 18   Temp:  98.7 F (37.1 C)    TempSrc:  Oral    SpO2: 95% 95% 97% 94%  SpO2: 94 % O2 Flow Rate (L/min): 3 L/min  Intake/Output Summary (Last 24 hours) at 01/13/2021 0806 Last data filed at 01/12/2021 1810 Gross per 24 hour  Intake --  Output 475 ml  Net -475 ml   Physical Exam Vitals and nursing note reviewed.  Constitutional:      General: He is not in acute distress.    Appearance: He is obese.  Cardiovascular:     Rate and Rhythm: Normal rate and regular rhythm.  Pulmonary:     Effort: Pulmonary effort is normal. No respiratory distress.     Breath sounds: Normal breath sounds.  Neurological:     Mental Status: He is alert.  Psychiatric:        Mood and Affect: Mood normal.        Behavior: Behavior normal.     Labs in last 24 hours: CBC Latest Ref Rng & Units 01/13/2021 01/12/2021 01/11/2021  WBC 4.0 - 10.5 K/uL 11.0(H) 9.2 8.5  Hemoglobin 13.0 - 17.0 g/dL 12.7(L) 13.0 14.2  Hematocrit 39.0 - 52.0 % 40.3 40.6 44.1  Platelets 150 - 400 K/uL 454(H) 413(H) 354   CMP Latest Ref Rng & Units 01/13/2021 01/12/2021 01/11/2021  Glucose 70 - 99 mg/dL 01/13/2021) 630(Z) 601(U)  BUN 6 - 20 mg/dL 20 18 14   Creatinine 0.61 - 1.24 mg/dL 932(T 5.57  Sodium 135 - 145 mmol/L 142 142 140  Potassium 3.5 - 5.1 mmol/L 3.6 3.4(L) 3.5  Chloride 98 - 111 mmol/L 107 106 101  CO2 22 - 32 mmol/L 24 24 25   Calcium 8.9 - 10.3 mg/dL 3.22) 8.3(L) 8.6(L)  Total Protein 6.5 - 8.1 g/dL 6.1(L)  6.1(L) 6.5  Total Bilirubin 0.3 - 1.2 mg/dL 1.1 0.25) 2.4(H)  Alkaline Phos 38 - 126 U/L 90 101 105  AST 15 - 41 U/L 45(H) 81(H) 58(H)  ALT 0 - 44 U/L 128(H) 129(H) 109(H)   CRP - 1.1 down from 3.0 and 5.1 Ferritin - 859 down from 1,267 and 1,703 D-dimer - 1.08 down from 1.63 and 1.82 Mg - 2.2  Imaging in last 24 hours: No results found.   Assessment/Plan:  Active Problems:   Acute hypoxemic respiratory failure due to COVID-19 [facility])  Mr. Quantavius Humm is a 40 year old male with past medical history significant for COVID-19 (tested positive on 01/10) and obesity who presented to [facility] on 01/15 for evaluation of worsening shortness of breath found to have acute hypoxemic respiratory failure 2/2 COVID-19 pneumonia.  #Acute hypoxemic respiratory failure 2/2 COVID-19 pneumonia, active Unvaccinated patient, currently on day 7 since testing positive for COVID-19 infection. Patient denies new or worsening symptoms at rest but continues to endorse cough and shortness of breath with ambulation. No fevers. Oxygen requirement de-escalated from 4L yesterday to 3L today. Inflammatory markers continue trending down. -Continue dexamethasone (  day 3/10) -CRP, CBC with Diff/Platelet, CMP, D-dimer, Ferritin daily -Continue 3L oxygen via nasal cannula to maintain SpO2 >92%, wean as tolerated -Airborne/contact precautions -Telemetry -Robitussin Q4H PRN -Self proning, incentive spirometry, flutter valve  #Elevated transaminases, active  AST and ALT elevated in the setting of active COVID-19 infection with slight worsening following adminstration of remdesivir on admission. Remdesivir discontinued on 1/16. -Continue treatment for COVID-19 as above -Continue monitoring liver function tests on daily labs  #Code status: Full code #Diet: Regular #VTE ppx: Lovenox 55mg  Q24H  , MD 01/13/2021, 8:06 AM Pager: [phone] After 5pm on weekdays and 1pm on weekends: On Call pager  [phone]

## 2021-01-13 NOTE — ED Notes (Signed)
Pt requesting to use the bedside commode for a bowel movement. Upon standing and sitting on the commode, pt started to have a panic attack stating "Last time I tried to sit up on the toilet I passed out when I tried having a bowel movement and I am just worried this is going to happen again." Pt is requesting to be placed back in bed. Pt placed back in bed with no complications. Pt's O2 saturation dropped to 83%. Titrated O2 to 3l [place]. Pt states he feels better back in bed. Call bell within reach. Bed in low position.

## 2021-01-13 NOTE — ED Notes (Signed)
Dinner tray provided to patient

## 2021-01-13 NOTE — ED Notes (Signed)
Breakfast Ordered 

## 2021-01-13 NOTE — ED Notes (Signed)
Lunch Tray Ordered @ 1109. °

## 2021-01-14 ENCOUNTER — Other Ambulatory Visit (HOSPITAL_COMMUNITY): Payer: Self-pay | Admitting: Student

## 2021-01-14 LAB — COMPREHENSIVE METABOLIC PANEL
ALT: 145 U/L — ABNORMAL HIGH (ref 0–44)
AST: 64 U/L — ABNORMAL HIGH (ref 15–41)
Albumin: 2.8 g/dL — ABNORMAL LOW (ref 3.5–5.0)
Alkaline Phosphatase: 80 U/L (ref 38–126)
Anion gap: 9 (ref 5–15)
BUN: 18 mg/dL (ref 6–20)
CO2: 24 mmol/L (ref 22–32)
Calcium: 8.3 mg/dL — ABNORMAL LOW (ref 8.9–10.3)
Chloride: 109 mmol/L (ref 98–111)
Creatinine, Ser: 0.97 mg/dL (ref 0.61–1.24)
GFR, Estimated: >60 mL/min (ref >60)
Glucose, Bld: 133 mg/dL — ABNORMAL HIGH (ref 70–99)
Potassium: 3.6 mmol/L (ref 3.5–5.1)
Sodium: 142 mmol/L (ref 135–145)
Total Bilirubin: 1 mg/dL (ref 0.3–1.2)
Total Protein: 6.1 g/dL — ABNORMAL LOW (ref 6.5–8.1)

## 2021-01-14 LAB — CBC WITH DIFFERENTIAL/PLATELET
Abs Immature Granulocytes: 0.47 10*3/uL — ABNORMAL HIGH (ref 0.00–0.07)
Basophils Absolute: 0 10*3/uL (ref 0.0–0.1)
Basophils Relative: 0 %
Eosinophils Absolute: 0 10*3/uL (ref 0.0–0.5)
Eosinophils Relative: 0 %
HCT: 36.9 % — ABNORMAL LOW (ref 39.0–52.0)
Hemoglobin: 12.5 g/dL — ABNORMAL LOW (ref 13.0–17.0)
Immature Granulocytes: 4 %
Lymphocytes Relative: 14 %
Lymphs Abs: 1.7 10*3/uL (ref 0.7–4.0)
MCH: 29.7 pg (ref 26.0–34.0)
MCHC: 33.9 g/dL (ref 30.0–36.0)
MCV: 87.6 fL (ref 80.0–100.0)
Monocytes Absolute: 1.4 10*3/uL — ABNORMAL HIGH (ref 0.1–1.0)
Monocytes Relative: 11 %
Neutro Abs: 8.4 10*3/uL — ABNORMAL HIGH (ref 1.7–7.7)
Neutrophils Relative %: 71 %
Platelets: 481 10*3/uL — ABNORMAL HIGH (ref 150–400)
RBC: 4.21 MIL/uL — ABNORMAL LOW (ref 4.22–5.81)
RDW: 12.6 % (ref 11.5–15.5)
WBC: 11.9 10*3/uL — ABNORMAL HIGH (ref 4.0–10.5)
nRBC: 0 % (ref 0.0–0.2)

## 2021-01-14 LAB — FERRITIN: Ferritin: 686 ng/mL — ABNORMAL HIGH (ref 24–336)

## 2021-01-14 LAB — C-REACTIVE PROTEIN: CRP: 0.7 mg/dL (ref <1.0)

## 2021-01-14 LAB — D-DIMER, QUANTITATIVE: D-Dimer, Quant: 0.79 ug/mL-FEU — ABNORMAL HIGH (ref 0.00–0.50)

## 2021-01-14 MED ORDER — GUAIFENESIN-DM 100-10 MG/5ML PO SYRP: 5.0000 mL | ORAL_SOLUTION | ORAL | 0 refills | Status: AC | PRN

## 2021-01-14 MED ORDER — DEXAMETHASONE 6 MG PO TABS: 6.0000 mg | ORAL_TABLET | Freq: Every day | ORAL | 0 refills | Status: DC

## 2021-01-14 MED FILL — ROBAFEN DM CGH-CHEST CONG S: 10-100 | 118 days supply | Qty: 118 | Fill #0

## 2021-01-14 MED FILL — DEXAMETHASONE 6 MG TABLET: 6 | 6 days supply | Qty: 6 | Fill #0

## 2021-01-14 NOTE — Discharge Summary (Signed)
Name: Mason Barnett MRN: [id number] DOB: [birth year] 40 y.o. PCP: Patient, No Pcp Per  Date of Admission: 01/11/2021  4:54 AM Date of Discharge: 01/14/2021 Attending Physician: Mason Aschoff, MD  Discharge Diagnosis: 1. Active Problems:   Acute hypoxemic respiratory failure due to COVID-19 [facility])  Discharge Medications: Allergies as of 01/14/2021   No Known Allergies     Medication List    STOP taking these medications   albuterol 108 (90 Base) MCG/ACT inhaler Commonly known as: VENTOLIN HFA   benzonatate 100 MG capsule Commonly known as: TESSALON   ibuprofen 600 MG tablet Commonly known as: ADVIL   ondansetron 4 MG disintegrating tablet Commonly known as: Zofran ODT     TAKE these medications   dexamethasone 6 MG tablet Commonly known as: DECADRON Take 1 tablet (6 mg total) by mouth daily. Start taking on: January 15, 2021   guaiFENesin-dextromethorphan 100-10 MG/5ML syrup Commonly known as: ROBITUSSIN DM Take 5 mLs by mouth every 4 (four) hours as needed for cough.      Disposition and follow-up:   Mr.Mason Barnett was discharged from [facility] in Stable condition.  At the hospital follow up visit please address:  1.  COVID-19 pneumonia: Patient tested positive for COVID-19 on 01/10 and was hospitalized for acute hypoxic respiratory failure requiring up to 4L oxygen via nasal cannula. He was titrated down to room air and discharged with six additional tablets of dexamethasone to take at home. Please assess patient's symptoms (cough, shortness of breath), oxygen status, and consider obtaining CBC and CMP to confirm resolution of steroid-induced leukocytosis and transaminitis secondary to COVID-19 infection.  2.  Labs / imaging needed at time of follow-up: CBC, CMP  3.  Pending labs/ test needing follow-up: None  Follow-up Appointments:  Follow-up Information    [facility]. Schedule an appointment as soon  as possible for a visit in 1 week(s).   Contact information: [address] [place] [address] [phone]              Hospital Course by problem list: 1. Acute hypoxic respiratory failure 2/2 COVID pneumonia: Patient presented to [facility] on 01/15 following a positive COVID test at urgent care on 01/10 found to have hypoxia to 89% with ambulation which improved on 2L oxygen via nasal cannula. Patient found to have elevated inflammatory markers and pneumonia on CXR. He was started on dexamethasone and remdesivir with escalation of his oxygen requirement to 4L nasal cannula. His remdesivir was discontinued due to bradycardia and mildly elevated transaminases, however he was continued on dexamethasone. His oxygen requirement improved to the point that he was able to maintain SpO2 >92% with ambulation on room air. He was discharged home with plan to complete six additional tablets of dexamethasone and follow-up with [facility].  Discharge Vitals:   BP 122/77 (BP Location: Left Arm)   Pulse (!) 57   Temp 98 F (36.7 C) (Oral)   Resp 20   SpO2 99%   Pertinent Labs, Studies, and Procedures:  CBC Latest Ref Rng & Units 01/14/2021 01/13/2021 01/12/2021  WBC 4.0 - 10.5 K/uL 11.9(H) 11.0(H) 9.2  Hemoglobin 13.0 - 17.0 g/dL 12.5(L) 12.7(L) 13.0  Hematocrit 39.0 - 52.0 % 36.9(L) 40.3 40.6  Platelets 150 - 400 K/uL 481(H) 454(H) 413(H)   CMP Latest Ref Rng & Units 01/14/2021 01/13/2021 01/12/2021  Glucose 70 - 99 mg/dL 956(O) 130(Q) 657(Q)  BUN 6 - 20 mg/dL 18 20 18   Creatinine  0.61 - 1.24 mg/dL 3.01 6.01 0.93  Sodium 135 - 145 mmol/L 142 142 142  Potassium 3.5 - 5.1 mmol/L 3.6 3.6 3.4(L)  Chloride 98 - 111 mmol/L 109 107 106  CO2 22 - 32 mmol/L 24 24 24   Calcium 8.9 - 10.3 mg/dL 8.3(L) 8.7(L) 8.3(L)  Total Protein 6.5 - 8.1 g/dL 6.1(L) 6.1(L) 6.1(L)  Total Bilirubin 0.3 - 1.2 mg/dL 1.0 1.1 )  Alkaline Phos 38 - 126 U/L 80 90 101  AST 15 - 41 U/L 64(H) 45(H) 81(H)  ALT 0 - 44 U/L  145(H) 128(H) 129(H)  DG Chest 2 View  Result Date: 01/06/2021 CLINICAL DATA:  Cough and shortness of breath.  COVID exposure. EXAM: CHEST - 2 VIEW COMPARISON:  None. FINDINGS: Patchy heterogeneous bilateral airspace opacities in a mid-lower lung zone predominant distribution. Heart is normal in size. Normal mediastinal contours. No pleural fluid or pneumothorax. No evidence of pneumomediastinum. No acute osseous abnormalities are seen. IMPRESSION: Patchy heterogeneous bilateral airspace opacities in a mid-lower lung zone predominant distribution, consistent with COVID-19 pneumonia. Parenchymal involvement is moderate by radiograph. Electronically Signed   By: 03/06/2021 M.D.   On: 01/06/2021 18:56   DG Chest Portable 1 View  Result Date: 01/11/2021 CLINICAL DATA:  COVID positive, dyspnea, intermittent cough for 1 week EXAM: PORTABLE CHEST 1 VIEW COMPARISON:  01/06/2021 chest radiograph. FINDINGS: Stable cardiomediastinal silhouette with normal heart size. No pneumothorax. No pleural effusion. Moderate to severe patchy bilateral lung opacities, worsened. IMPRESSION: Worsening moderate to severe patchy bilateral lung opacities compatible with COVID-19 pneumonia. Electronically Signed   By: 03/06/2021 M.D.   On: 01/11/2021 08:52   Discharge Instructions: Discharge Instructions    Call MD for:  difficulty breathing, headache or visual disturbances   Complete by: As directed    Call MD for:  extreme fatigue   Complete by: As directed    Call MD for:  hives   Complete by: As directed    Call MD for:  persistant dizziness or light-headedness   Complete by: As directed    Call MD for:  persistant nausea and vomiting   Complete by: As directed    Call MD for:  redness, tenderness, or signs of infection (pain, swelling, redness, odor or green/yellow discharge around incision site)   Complete by: As directed    Call MD for:  severe uncontrolled pain   Complete by: As directed    Call MD for:   temperature >100.4   Complete by: As directed    Diet - low sodium heart healthy   Complete by: As directed    Increase activity slowly   Complete by: As directed      Signed: 01/13/2021, MD 01/14/2021, 11:46 AM   Pager: [phone]

## 2021-01-14 NOTE — Progress Notes (Signed)
DISCHARGE NOTE HOME Mason Barnett to be discharged Home per MD order. Discussed prescriptions and follow up appointments with the patient. Prescriptions given to patient; medication list explained in detail. Patient verbalized understanding.  Skin clean, dry and intact without evidence of skin break down, no evidence of skin tears noted. IV catheter discontinued intact. Site without signs and symptoms of complications. Dressing and pressure applied. Pt denies pain at the site currently. No complaints noted.  Patient free of lines, drains, and wounds.   An After Visit Summary (AVS) was printed and given to the patient. Patient escorted via wheelchair, and discharged home via private auto.  Lorine Bears, RN

## 2021-01-14 NOTE — Discharge Instructions (Signed)
Mason Barnett,  It was a pleasure meeting you during your recent hospitalization. We are glad that you are doing well. Following discharge, you will need to remain isolated for 20 days following your positive COVID-19 test on 01/06/21. You can break isolation on January 30th. We would like for you to complete your 10 day course of dexamethasone. You have received four doses of this medication in the hospital. You will receive six more pills which you should take daily starting tomorrow. We would like for you to follow-up with our clinic, [facility], for hospital follow-up.  Sincerely, Jasmine December, MD

## 2021-01-14 NOTE — Progress Notes (Signed)
Patient arrived to unit 5 m bed 7 from emergency department .Oriented patient to nursing unit ,call bell ,phone,and personal items within reach.No acute distress noted patient time .Will continue to monitor.

## 2021-01-14 NOTE — Progress Notes (Signed)
Subjective:   Overnight, patient transferred out of ED to hospital floor.  This morning, patient reports that he feels well. He states that his shortness of breath and cough only occur with ambulation and have improved significantly. He states that he has been able to ambulate around his room and to the bathroom without complication. He denies having an established primary care physician and would like to see our clinic for hospital follow-up. He does plan to move soon though, so he may not be able to follow with our clinic for the long-term. He understands and agrees with the need to remain isolated for 21 days since his positive test and complete his 10-day course of dexamethasone.  Objective:  Vital signs in last 24 hours: Vitals:   01/13/21 2300 01/13/21 2315 01/14/21 0047 01/14/21 0537  BP: 126/68 131/72 (!) 141/81 122/77  Pulse: 61 (!) 57 (!) 52 (!) 57  Resp: 17 20 18 20   Temp:  98.6 F (37 C) 98.7 F (37.1 C) 98 F (36.7 C)  TempSrc:  Oral Oral Oral  SpO2: 96% 97% 99% 99%  On room air  Intake/Output Summary (Last 24 hours) at 01/14/2021 1137 Last data filed at 01/14/2021 0900 Gross per 24 hour  Intake 180 ml  Output 700 ml  Net -520 ml   Physical Exam Vitals and nursing note reviewed.  Constitutional:      General: He is not in acute distress.    Appearance: He is not ill-appearing.  Cardiovascular:     Rate and Rhythm: Normal rate and regular rhythm.  Pulmonary:     Effort: Pulmonary effort is normal. No respiratory distress.     Breath sounds: Normal breath sounds.  Neurological:     Mental Status: He is alert.  Psychiatric:        Mood and Affect: Mood normal.        Behavior: Behavior normal.     Labs in last 24 hours: CBC Latest Ref Rng & Units 01/14/2021 01/13/2021 01/12/2021  WBC 4.0 - 10.5 K/uL 11.9(H) 11.0(H) 9.2  Hemoglobin 13.0 - 17.0 g/dL 12.5(L) 12.7(L) 13.0  Hematocrit 39.0 - 52.0 % 36.9(L) 40.3 40.6  Platelets 150 - 400 K/uL 481(H) 454(H) 413(H)    CMP Latest Ref Rng & Units 01/14/2021 01/13/2021 01/12/2021  Glucose 70 - 99 mg/dL 01/14/2021) 094(B) 096(G)  BUN 6 - 20 mg/dL 18 20 18   Creatinine 0.61 - 1.24 mg/dL 836(O 2.94  Sodium 135 - 145 mmol/L 142 142 142  Potassium 3.5 - 5.1 mmol/L 3.6 3.6 3.4(L)  Chloride 98 - 111 mmol/L 109 107 106  CO2 22 - 32 mmol/L 24 24 24   Calcium 8.9 - 10.3 mg/dL 8.3(L) 8.7(L) 8.3(L)  Total Protein 6.5 - 8.1 g/dL 6.1(L) 6.1(L) 6.1(L)  Total Bilirubin 0.3 - 1.2 mg/dL 1.0 1.1 7.65)  Alkaline Phos 38 - 126 U/L 80 90 101  AST 15 - 41 U/L 64(H) 45(H) 81(H)  ALT 0 - 44 U/L 145(H) 128(H) 129(H)   CRP - 0.7 down from 1.1, 3.0, 5.1 Ferritin - 686 down from 859, 1,267, 1,703 D-dimer - 0.79 down from 1.08, 1.63, 1.82  Imaging in last 24 hours: No results found.   Assessment/Plan:  Active Problems:   Acute hypoxemic respiratory failure due to COVID-19 [facility])  Mason Barnett is a 40 year old male with past medical history significant for COVID-19 (tested positive on 01/10) who presented to [facility] on 01/15 for evaluation of worsening shortness of breath found to have acute  hypoxemic respiratory failure 2/2 COVID-19 pneumonia.  #Acute hypoxemic respiratory failure 2/2 COVID-19 pneumonia, active Unvaccinated patient, currently on day 8 since testing positive for COVID-19 infection. Patient's shortness of breath and cough significantly improving. No fevers. Oxygen requirement de-escalated to 1-2L overnight. Inflammatory markers continue to trend down. Patient may benefit from transition to room air and ambulation with nursing to determine whether patient will need oxygen supplementation upon discharge. -Continue dexamethasone (day 4/10) -Wean oxygen to room air and ambulate with nursing staff -Airborne/contact precautions -Robitussin Q4H PRN -Self proning, incentive spirometry, flutter valve  #Elevated transaminases, active  AST and ALT elevated in the setting of active COVID-19 infection with slight  worsening following adminstration of remdesivir on admission. Remdesivir discontinued on 1/16. -Continue treatment for COVID-19 as above -Follow-up with PCP for further monitoring  #Code status: Full code #Diet: Regular #VTE ppx: Lovenox 55mg  Q24H  , MD 01/14/2021, 11:37 AM Pager: [phone] After 5pm on weekdays and 1pm on weekends: On Call pager [phone]

## 2021-01-14 NOTE — Progress Notes (Signed)
SATURATION QUALIFICATIONS: (This note is used to comply with regulatory documentation for home oxygen)  Patient Saturations on Room Air at Rest = 97%  Patient Saturations on Room Air while Ambulating = 95%  Myrtis Hopping, RN

## 2021-01-14 NOTE — Plan of Care (Signed)

## 2021-01-16 LAB — CULTURE, BLOOD (ROUTINE X 2)
Culture: NO GROWTH
Culture: NO GROWTH
Special Requests: ADEQUATE
Special Requests: ADEQUATE

## 2021-01-27 ENCOUNTER — Encounter: Payer: BC Managed Care – PPO | Admitting: Student

## 2021-01-27 ENCOUNTER — Telehealth: Payer: Self-pay | Admitting: *Deleted

## 2021-01-27 NOTE — Telephone Encounter (Signed)
Called patient left voice message for patient to return call to clinic [phone] to reschedule 01-27-2021 missed appointment.

## 2021-11-26 IMAGING — DX DG CHEST 2V
2 series · 2 of 2 positions shown · non-contrast
Comparison: None.

CLINICAL DATA: Cough and shortness of breath.  COVID exposure.

EXAM:
CHEST - 2 VIEW

[chest pa]
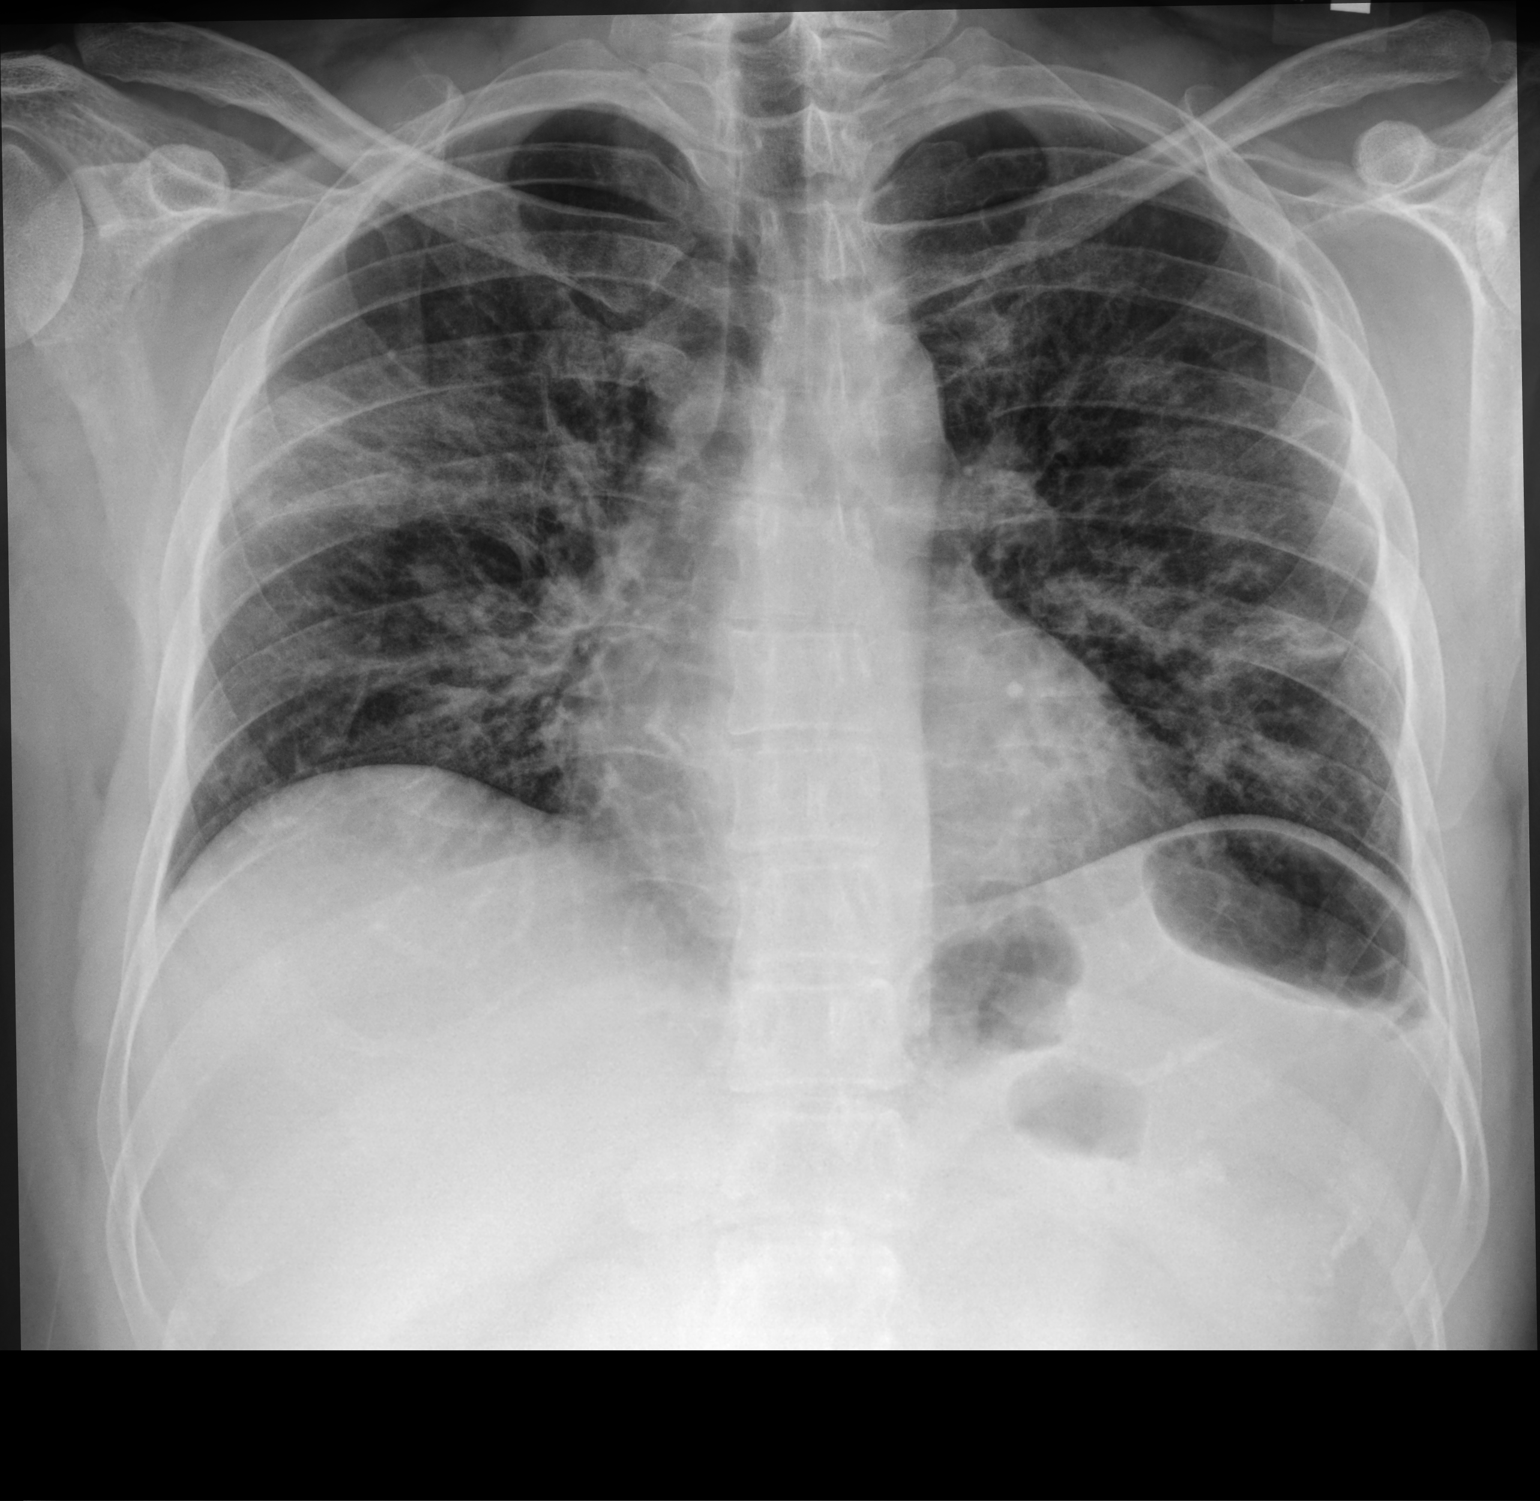

[chest lat]
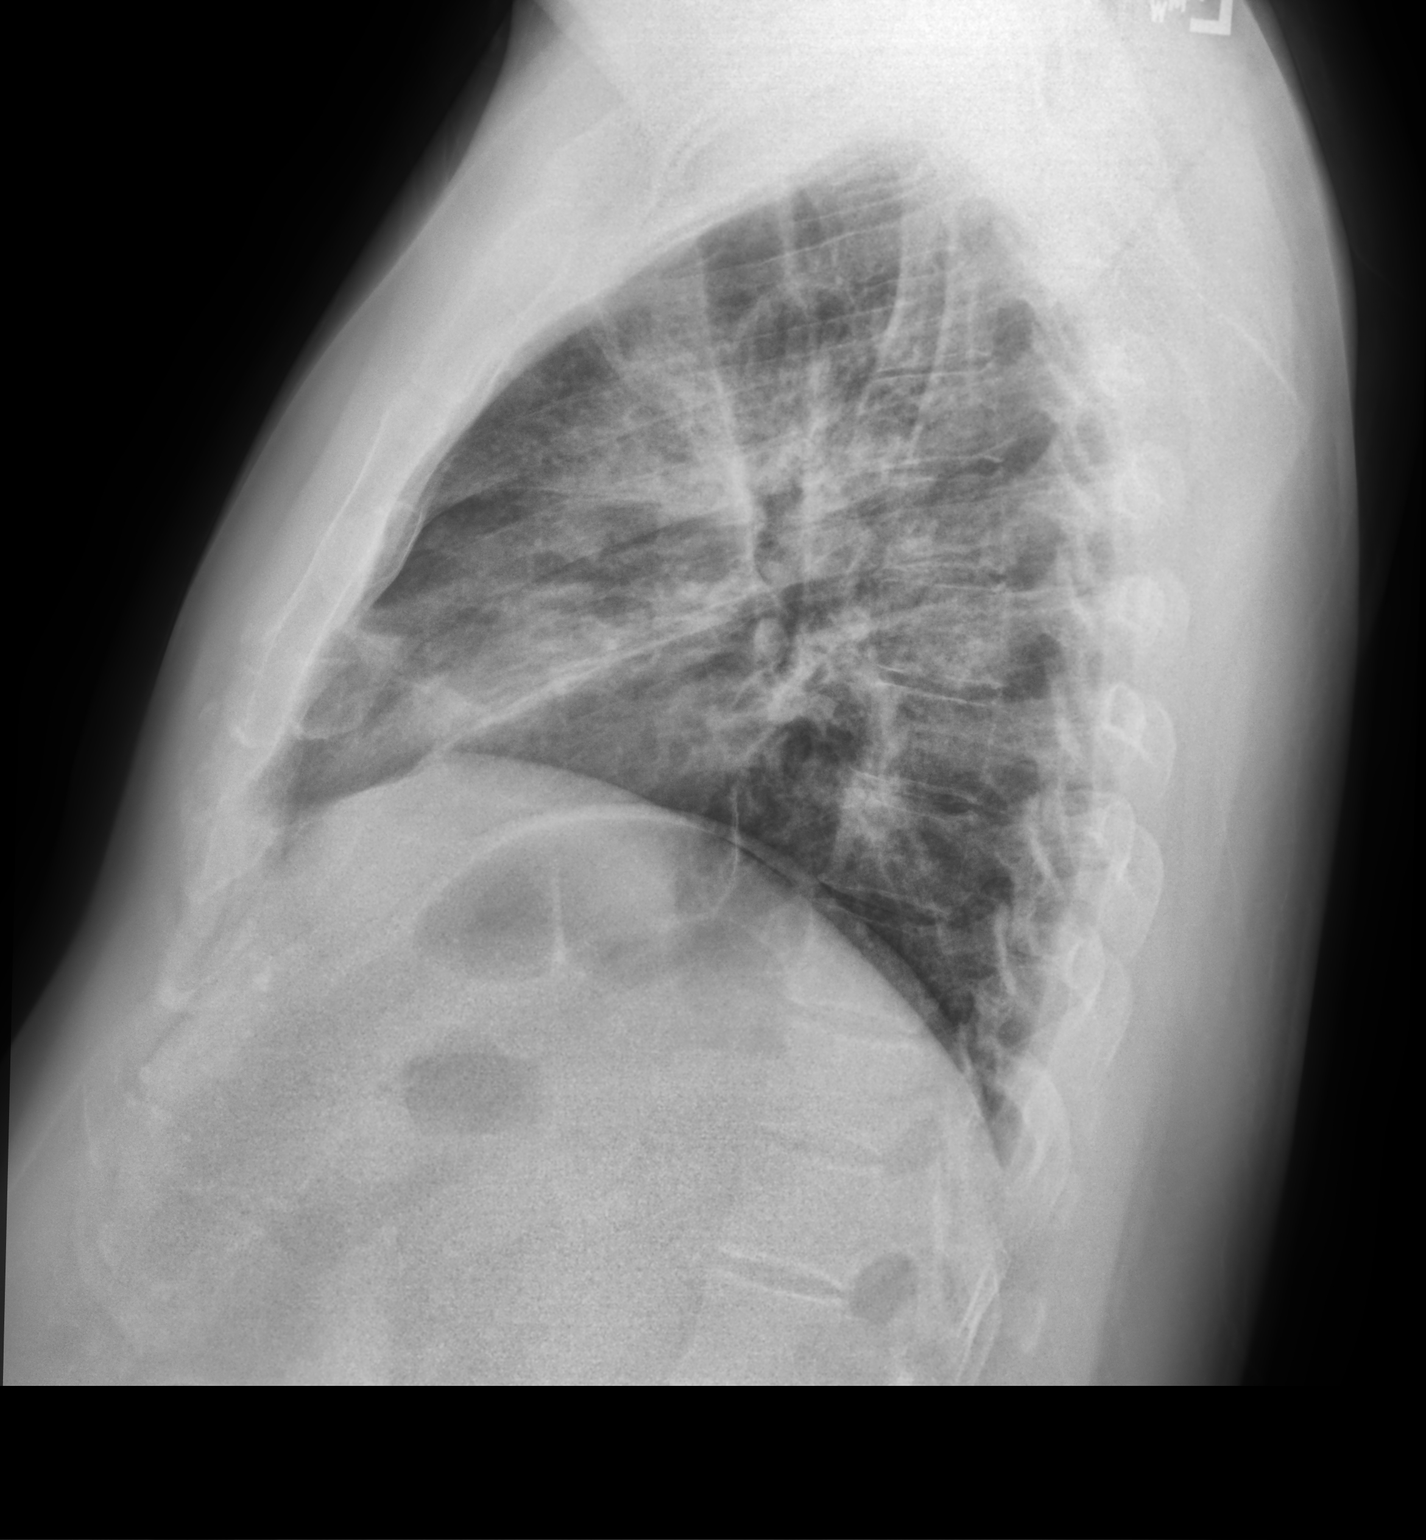

[2 of 2 positions shown; findings below may reference images not displayed]

FINDINGS: Patchy heterogeneous bilateral airspace opacities in a mid-lower
lung zone predominant distribution. Heart is normal in size. Normal
mediastinal contours. No pleural fluid or pneumothorax. No evidence
of pneumomediastinum. No acute osseous abnormalities are seen.
IMPRESSION: Patchy heterogeneous bilateral airspace opacities in a mid-lower
lung zone predominant distribution, consistent with [id number]-E7
pneumonia. Parenchymal involvement is moderate by radiograph.

## 2021-12-01 IMAGING — DX DG CHEST 1V PORT
1 series · 1 of 1 positions shown · non-contrast
Comparison: 01/06/2021 chest radiograph.

CLINICAL DATA: COVID positive, dyspnea, intermittent cough for 1
week

EXAM:
PORTABLE CHEST 1 VIEW

[chest ap]
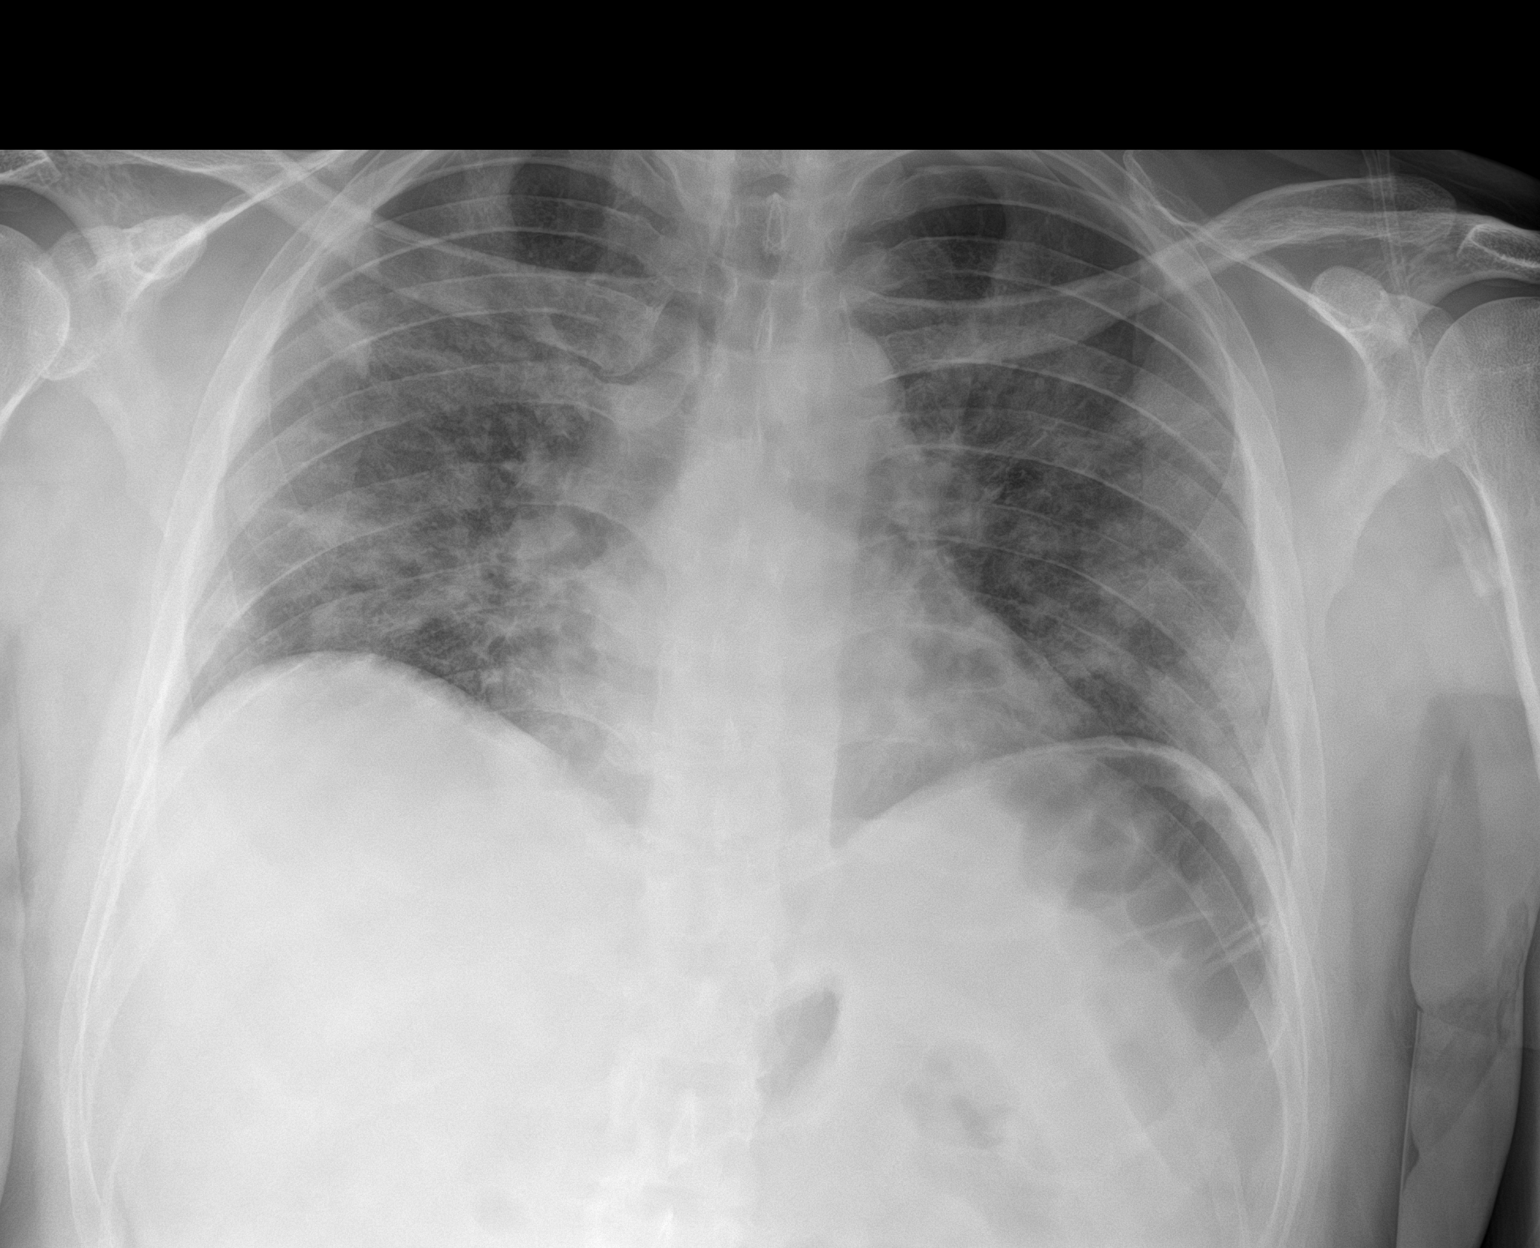

[1 of 1 positions shown; findings below may reference images not displayed]

FINDINGS: Stable cardiomediastinal silhouette with normal heart size. No
pneumothorax. No pleural effusion. Moderate to severe patchy
bilateral lung opacities, worsened.
IMPRESSION: Worsening moderate to severe patchy bilateral lung opacities
compatible with 2O7I3-IV pneumonia.
